# Patient Record
Sex: Female | Born: 1970 | Race: White | Hispanic: Yes | Marital: Single | Smoking: Never smoker
Health system: Southern US, Community
[De-identification: ages and names within clinical notes are randomized; demographics above are authoritative.]

---

## 2007-08-23 ENCOUNTER — Inpatient Hospital Stay (HOSPITAL_COMMUNITY): Admission: AD | Admit: 2007-08-23 | Discharge: 2007-08-23 | Payer: Self-pay | Admitting: Obstetrics and Gynecology

## 2008-11-29 IMAGING — US US OB COMP LESS 14 WK
1 series · 14 of 28 positions shown · non-contrast
Comparison: none

OBSTETRICAL ULTRASOUND:

 This ultrasound exam was performed in the [HOSPITAL] Ultrasound Department.  The OB US report was generated in the AS system, and faxed to the ordering physician.  This report is also available in [REDACTED] PACS.

[Series 1: us ob comp less 14 wk · 0.26mm/px · 48 acquisitions, 14 frames shown]
[im 2/48]
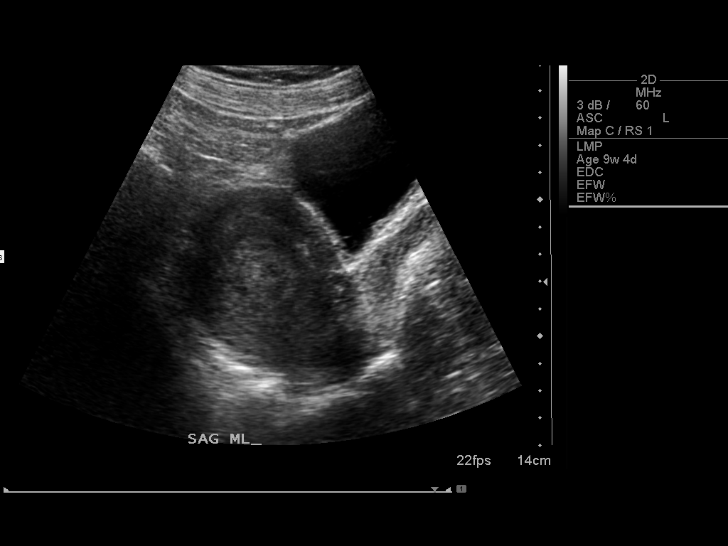
[im 6/48]
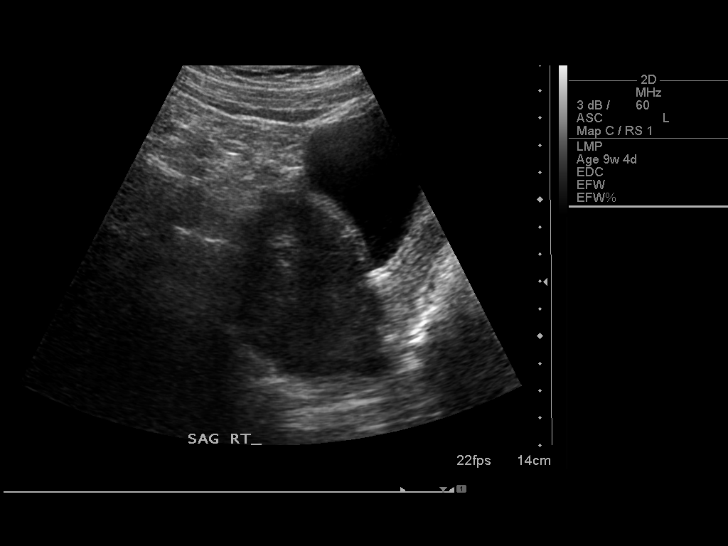
[im 9/48]
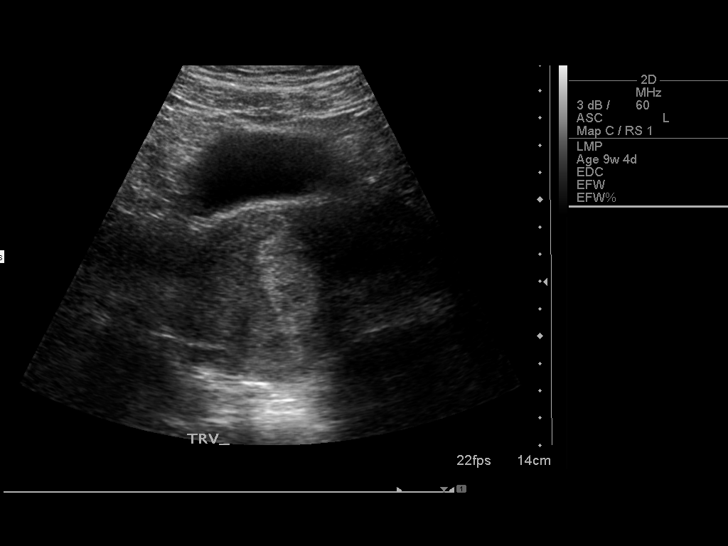
[im 13/48]
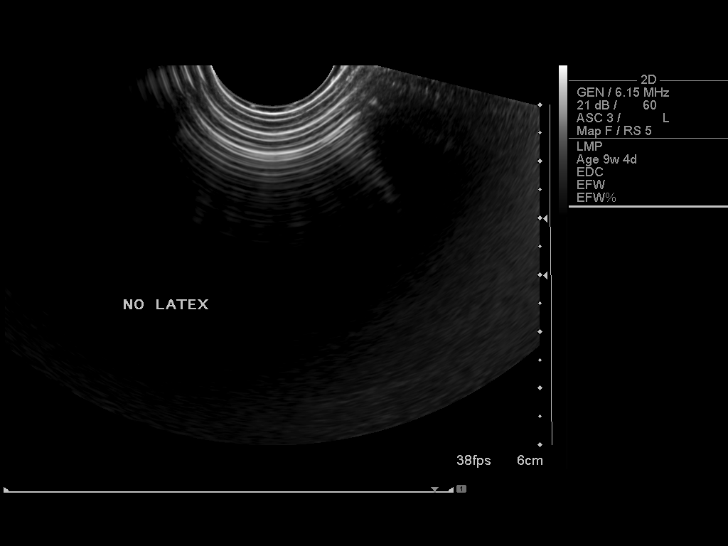
[im 16/48]
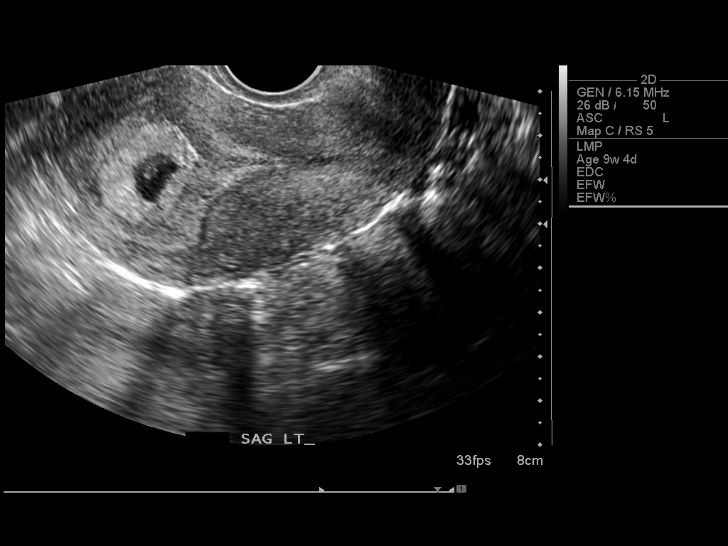
[im 20/48]
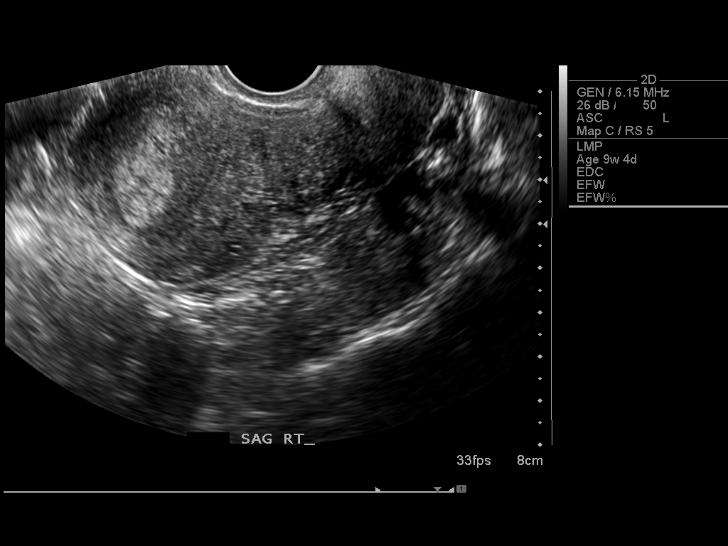
[im 23/48]
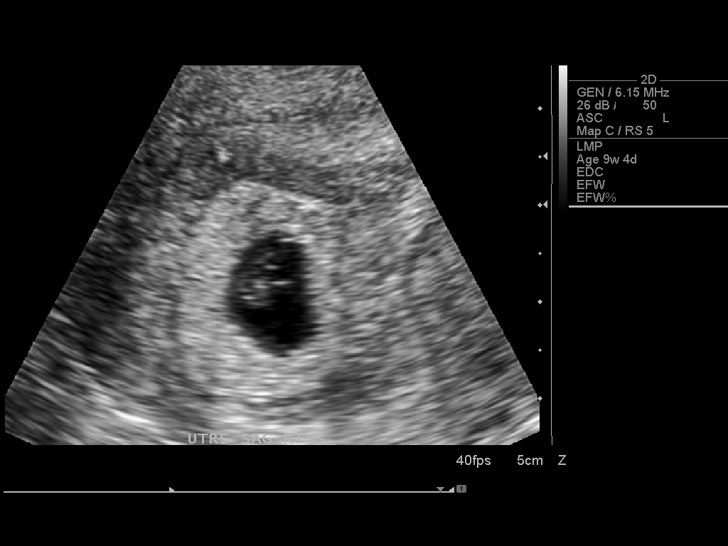
[im 27/48]
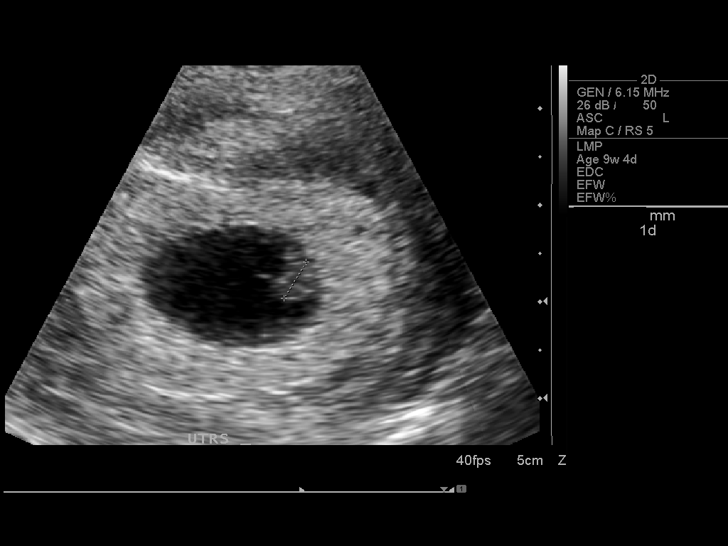
[im 30/48]
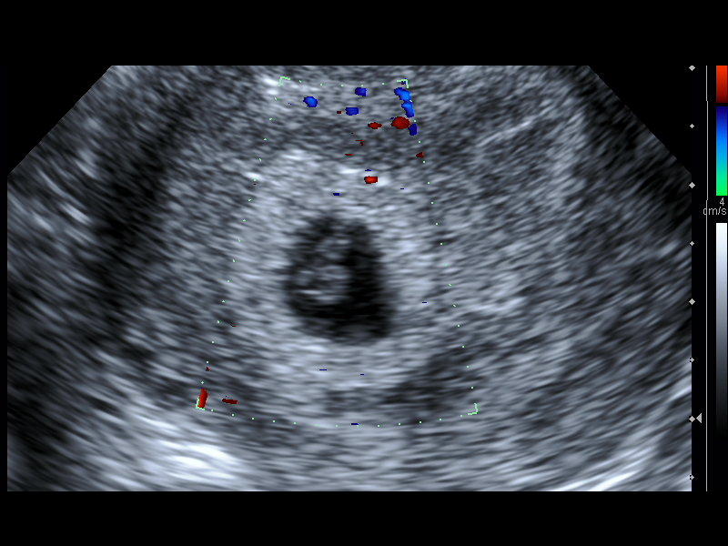
[im 34/48]
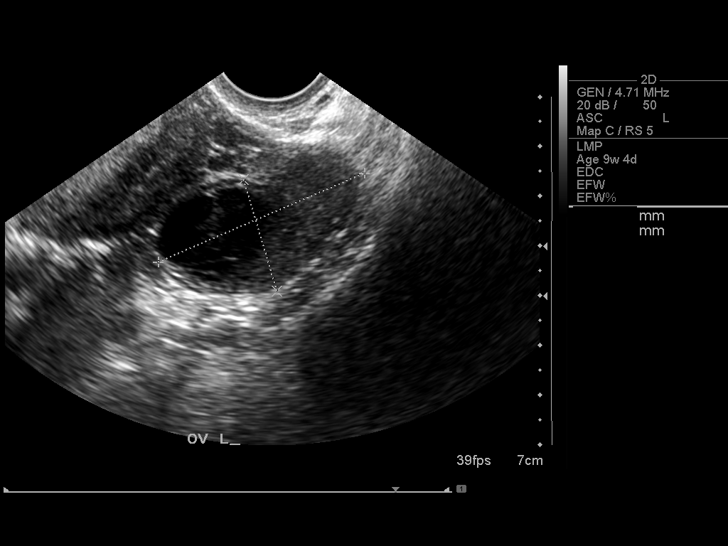
[im 37/48]
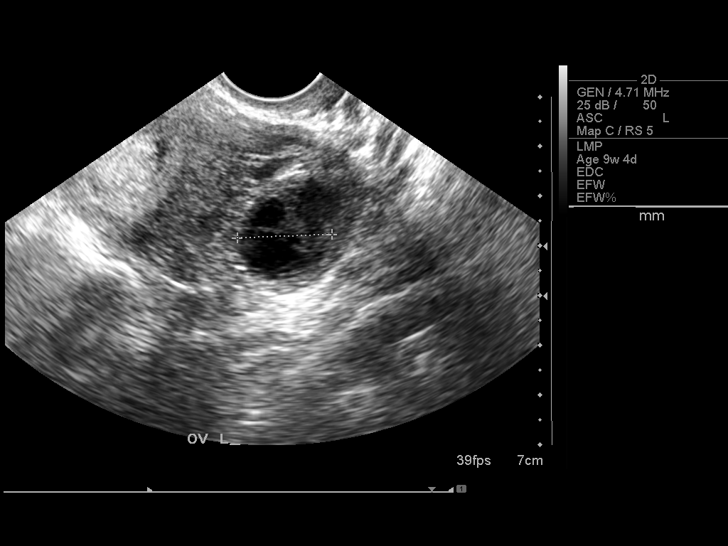
[im 41/48]
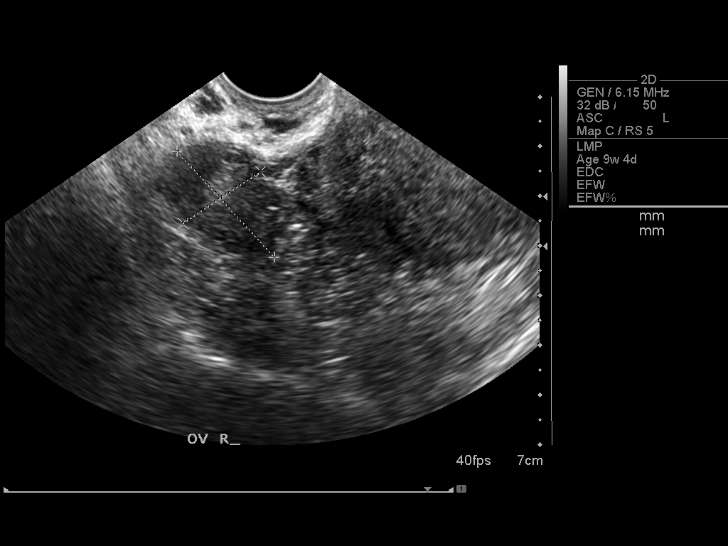
[im 44/48]
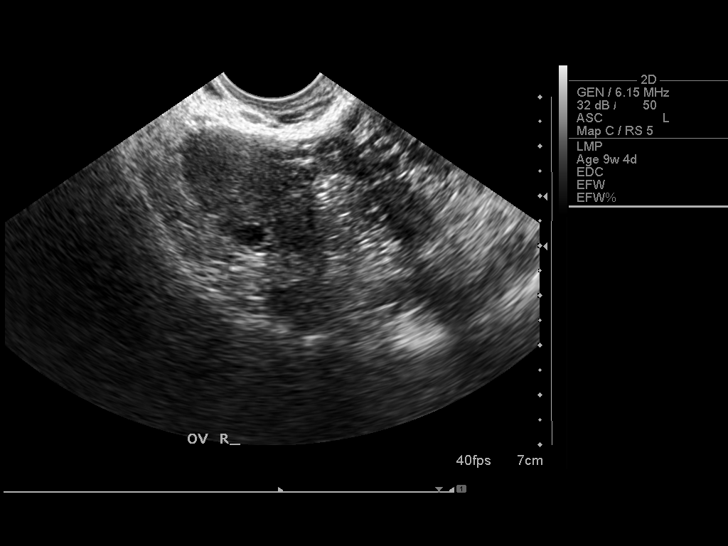
[im 48/48]
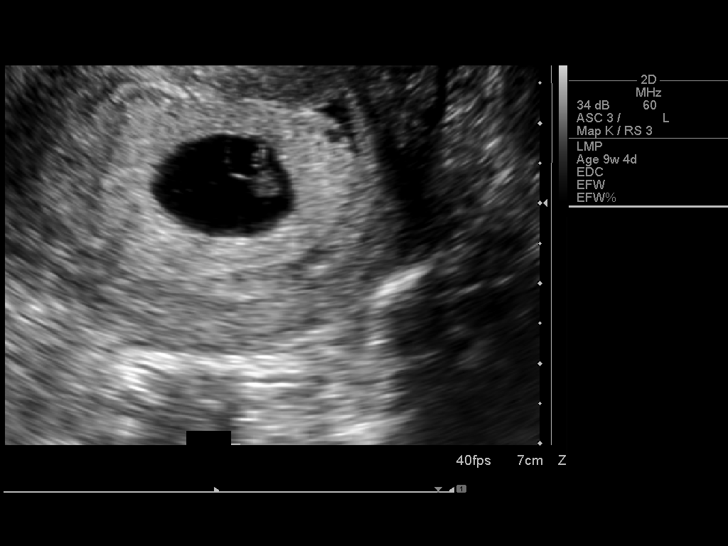

[14 of 28 positions shown; findings below may reference images not displayed]

IMPRESSION: See AS Obstetric US report.

## 2015-03-02 ENCOUNTER — Ambulatory Visit (INDEPENDENT_AMBULATORY_CARE_PROVIDER_SITE_OTHER): Payer: Self-pay | Admitting: Emergency Medicine

## 2015-03-02 ENCOUNTER — Ambulatory Visit (INDEPENDENT_AMBULATORY_CARE_PROVIDER_SITE_OTHER): Payer: Self-pay

## 2015-03-02 VITALS — BP 128/80 | HR 82 | Temp 97.9°F | Resp 16 | Ht 63.0 in | Wt 114.6 lb

## 2015-03-02 DIAGNOSIS — M25572 Pain in left ankle and joints of left foot: Secondary | ICD-10-CM

## 2015-03-02 DIAGNOSIS — S92302A Fracture of unspecified metatarsal bone(s), left foot, initial encounter for closed fracture: Secondary | ICD-10-CM

## 2015-03-02 NOTE — Progress Notes (Signed)
Subjective:  Patient ID: Kathy Larson, female    DOB: January 09, 1971  Age: 44 y.o. MRN: [id number]  CC: Foot Pain   HPI [name] presents  Left foot. She was descending a set of stairs and twisted her foot. She says this happened on Wednesday and she's had progressive swelling and ecchymosis of the foot laterally. She has no ankle pain. She denies any other complaints.  History Kathy Larson has no past medical history on file.   She has no past surgical history on file.   Her  family history includes Diabetes in her father.  She   reports that she has never smoked. She does not have any smokeless tobacco history on file. Her alcohol and drug histories are not on file.  No outpatient prescriptions prior to visit.   No facility-administered medications prior to visit.    Social History   Social History  . Marital Status: Single    Spouse Name: N/A  . Number of Children: N/A  . Years of Education: N/A   Social History Main Topics  . Smoking status: Never Smoker   . Smokeless tobacco: None  . Alcohol Use: None  . Drug Use: None  . Sexual Activity: Not Asked   Other Topics Concern  . None   Social History Narrative  . None     Review of Systems  Constitutional: Negative for fever, chills and appetite change.  HENT: Negative for congestion, ear pain, postnasal drip, sinus pressure and sore throat.   Eyes: Negative for pain and redness.  Respiratory: Negative for cough, shortness of breath and wheezing.   Cardiovascular: Negative for leg swelling.  Gastrointestinal: Negative for nausea, vomiting, abdominal pain, diarrhea, constipation and blood in stool.  Endocrine: Negative for polyuria.  Genitourinary: Negative for dysuria, urgency, frequency and flank pain.  Musculoskeletal: Negative for gait problem.  Skin: Negative for rash.  Neurological: Negative for weakness and headaches.  Psychiatric/Behavioral: Negative for confusion and decreased  concentration. The patient is not nervous/anxious.     Objective:  BP 128/80 mmHg  Pulse 82  Temp(Src) 97.9 F (36.6 C) (Oral)  Resp 16  Ht 5\' 3"  (1.6 m)  Wt 114 lb 9.6 oz (51.982 kg)  BMI 20.31 kg/m2  SpO2 99%  LMP 02/08/2015 she has a lateralShe   Physical Exam  Constitutional: She is oriented to person, place, and time. She appears well-developed and well-nourished. No distress.  HENT:  Head: Normocephalic and atraumatic.  Right Ear: External ear normal.  Left Ear: External ear normal.  Nose: Nose normal.  Eyes: Conjunctivae and EOM are normal. Pupils are equal, round, and reactive to light. No scleral icterus.  Neck: Normal range of motion. Neck supple. No tracheal deviation present.  Cardiovascular: Normal rate, regular rhythm and normal heart sounds.   Pulmonary/Chest: Effort normal. No respiratory distress. She has no wheezes. She has no rales.  Abdominal: She exhibits no mass. There is no tenderness. There is no rebound and no guarding.  Musculoskeletal: She exhibits no edema.       Left foot: There is decreased range of motion, tenderness and swelling.  Lymphadenopathy:    She has no cervical adenopathy.  Neurological: She is alert and oriented to person, place, and time. Coordination normal.  Skin: Skin is warm and dry. No rash noted.  Psychiatric: She has a normal mood and affect. Her behavior is normal.   She has brusing of the lateral food   Assessment & Plan:   Kathy Larson was seen today  for foot pain.  Diagnoses and all orders for this visit:  Closed fracture of metatarsal bone, left, initial encounter -     Ambulatory referral to Orthopedic Surgery  Pain in joint, ankle and foot, left -     DG Foot Complete Left; Future   I am having Ms. Apsey maintain her Pseudoephedrine-APAP-DM (DAYQUIL PO).  Meds ordered this encounter  Medications  . Pseudoephedrine-APAP-DM (DAYQUIL PO)    Sig: Take by mouth.    Appropriate red flag conditions were  discussed with the patient as well as actions that should be taken.  Patient expressed his understanding.  Follow-up: Return if symptoms worsen or fail to improve.  Carmelina Dane, MD [facility] reading (PRIMARY) by  Dr. Dareen Piano.  Displaced fracture base 5th MT.

## 2015-03-02 NOTE — Patient Instructions (Signed)
Fractura del metatarso sin desplazamiento °(Metatarsal Fracture, Undisplaced) °Usted ha sufrido una fractura (quebradura) en uno o más huesos del pie. Estos huesos conectan los dedos con los huesos del tobillo. °DIAGNÓSTICO °El diagnóstico (conocer el problema) de estas fracturas generalmente se realiza con facilidad, por medio de radiografías. Si hay problemas en el antepié y las radiografías son normales, un control posterior con gammagrafía ósea puede asegurar el diagnóstico.  °TRATAMIENTO E INSTRUCCIONES PARA EL CUIDADO DOMICILIARIO  °· El tratamiento podrá incluir o no un yeso o una bota. Cuando es necesario colocar un yeso, generalmente se usa por un período breve para no hacer más lenta la curación por la atrofia muscular (pérdida del músculo). °· Debe suspender las actividades hasta que el profesional que lo asiste se lo indique. °· Use zapatos que permitan amortiguar los impactos. °· Podrá realizar ejercicios alternativos mientras espera que el hueso se cure. Entre ellos se incluyen el ciclismo y la natación, o aquellos que el profesional le aconseje. °· Es importante concurrir a todas las visitas de seguimiento o a las derivaciones a otros especialistas. Si no cumple con el seguimiento podrá resultar en la curación incorrecta del hueso, dolor crónico o discapacidad. °SI NO LE HAN COLOCADO UN YESO O UNA TABLILLA: °· Podrá caminar con el pie lesionado según lo tolere o como se lo hayan aconsejado. °· No apoye el peso sobre el pie lesionado durante el tiempo que se lo indique su médico. Aumente lentamente la cantidad de tiempo que camina sobre el pie, hasta que el dolor se lo permita, o según se lo hayan indicado. °· Use muletas hasta que pueda soportar el peso sin dolor. Un aumento gradual del peso puede ayudarlo. °· Aplique hielo sobre la lesión durante 15 a 20 minutos por hora mientras se encuentre despierto, durante los 2 primeros días. Ponga el hielo en una bolsa plástica y coloque una toalla entre la  bolsa y la piel. °· Utilice los medicamentos de venta libre o de prescripción para el dolor, el malestar o la fiebre, según se lo indique el profesional que lo asiste. °SOLICITE ATENCIÓN MÉDICA DE INMEDIATO SI: °· El yeso se daña o se rompe. °· Siente un dolor intenso y continuo o está más hinchado que antes de colocarle el yeso o el dolor no se alivia con los medicamentos. °· La piel o las uñas que se encuentran por debajo de la lesión se vuelven azules o grises, o siente frío o entumecimiento. °· Hay mal olor o aparecen nuevas manchas o un drenaje purulento (similar al pus) por debajo del yeso. °ESTÉ SEGURO QUE:  °· Comprende las instrucciones para el alta médica. °· Controlará su enfermedad. °· Solicitará atención médica de inmediato según las indicaciones. °Document Released: 04/07/2005 Document Revised: 09/20/2011 °[employer]® Patient Information ©2015 [employer]. This information is not intended to replace advice given to you by your health care provider. Make sure you discuss any questions you have with your health care provider. ° °

## 2015-03-04 ENCOUNTER — Telehealth: Payer: Self-pay | Admitting: Emergency Medicine

## 2015-03-04 NOTE — Telephone Encounter (Signed)
Patient called to follow up about her referral.   [phone]

## 2019-07-31 ENCOUNTER — Ambulatory Visit: Payer: Self-pay | Attending: Internal Medicine

## 2019-07-31 DIAGNOSIS — Z20822 Contact with and (suspected) exposure to covid-19: Secondary | ICD-10-CM | POA: Insufficient documentation

## 2019-08-02 LAB — NOVEL CORONAVIRUS, NAA: SARS-CoV-2, NAA: NOT DETECTED
